# Patient Record
Sex: Male | Born: 1975 | Race: Black or African American | Hispanic: No | Marital: Married | Smoking: Never smoker
Health system: Southern US, Community
[De-identification: ages and names within clinical notes are randomized; demographics above are authoritative.]

## PROBLEM LIST (undated history)

## (undated) DIAGNOSIS — E785 Hyperlipidemia, unspecified: Secondary | ICD-10-CM

## (undated) DIAGNOSIS — K5792 Diverticulitis of intestine, part unspecified, without perforation or abscess without bleeding: Secondary | ICD-10-CM

## (undated) DIAGNOSIS — E119 Type 2 diabetes mellitus without complications: Secondary | ICD-10-CM

## (undated) DIAGNOSIS — I10 Essential (primary) hypertension: Secondary | ICD-10-CM

---

## 2015-05-23 ENCOUNTER — Encounter (HOSPITAL_COMMUNITY): Payer: Self-pay | Admitting: Emergency Medicine

## 2015-05-23 DIAGNOSIS — I10 Essential (primary) hypertension: Secondary | ICD-10-CM | POA: Diagnosis not present

## 2015-05-23 DIAGNOSIS — N4889 Other specified disorders of penis: Secondary | ICD-10-CM | POA: Insufficient documentation

## 2015-05-23 DIAGNOSIS — E119 Type 2 diabetes mellitus without complications: Secondary | ICD-10-CM | POA: Diagnosis not present

## 2015-05-23 DIAGNOSIS — K5732 Diverticulitis of large intestine without perforation or abscess without bleeding: Secondary | ICD-10-CM | POA: Diagnosis not present

## 2015-05-23 DIAGNOSIS — N50819 Testicular pain, unspecified: Secondary | ICD-10-CM | POA: Insufficient documentation

## 2015-05-23 DIAGNOSIS — R1033 Periumbilical pain: Secondary | ICD-10-CM | POA: Diagnosis present

## 2015-05-23 LAB — I-STAT CG4 LACTIC ACID, ED: Lactic Acid, Venous: 1.49 mmol/L (ref 0.5–2.0)

## 2015-05-23 NOTE — ED Notes (Signed)
Pt with hx of diverticulitis. Seen yesterday at his work clinic and told he possibly had a prostate infection. Pt reports he had pain when bending over and also pain in his penis. Pt on antibiotics. Pt sts pain continues today with nvd and fever.

## 2015-05-24 ENCOUNTER — Encounter (HOSPITAL_COMMUNITY): Payer: Self-pay | Admitting: Radiology

## 2015-05-24 ENCOUNTER — Emergency Department (HOSPITAL_COMMUNITY): Payer: BLUE CROSS/BLUE SHIELD

## 2015-05-24 ENCOUNTER — Emergency Department (HOSPITAL_COMMUNITY)
Admission: EM | Admit: 2015-05-24 | Discharge: 2015-05-24 | Disposition: A | Discharge status: Home / Self Care | Payer: BLUE CROSS/BLUE SHIELD | Attending: Emergency Medicine | Admitting: Emergency Medicine

## 2015-05-24 DIAGNOSIS — K5732 Diverticulitis of large intestine without perforation or abscess without bleeding: Secondary | ICD-10-CM

## 2015-05-24 HISTORY — DX: Hyperlipidemia, unspecified: E78.5

## 2015-05-24 HISTORY — DX: Essential (primary) hypertension: I10

## 2015-05-24 HISTORY — DX: Type 2 diabetes mellitus without complications: E11.9

## 2015-05-24 HISTORY — DX: Diverticulitis of intestine, part unspecified, without perforation or abscess without bleeding: K57.92

## 2015-05-24 LAB — COMPREHENSIVE METABOLIC PANEL
ALBUMIN: 3.5 g/dL (ref 3.5–5.0)
ALT: 33 U/L (ref 17–63)
ANION GAP: 13 (ref 5–15)
AST: 27 U/L (ref 15–41)
Alkaline Phosphatase: 60 U/L (ref 38–126)
BILIRUBIN TOTAL: 0.8 mg/dL (ref 0.3–1.2)
BUN: 11 mg/dL (ref 6–20)
CHLORIDE: 101 mmol/L (ref 101–111)
CO2: 25 mmol/L (ref 22–32)
Calcium: 9.1 mg/dL (ref 8.9–10.3)
Creatinine, Ser: 1.13 mg/dL (ref 0.61–1.24)
GFR calc Af Amer: >60 mL/min (ref >60)
GFR calc non Af Amer: >60 mL/min (ref >60)
GLUCOSE: 130 mg/dL — AB (ref 65–99)
POTASSIUM: 3.7 mmol/L (ref 3.5–5.1)
SODIUM: 139 mmol/L (ref 135–145)
TOTAL PROTEIN: 7.2 g/dL (ref 6.5–8.1)

## 2015-05-24 LAB — CBC
HEMATOCRIT: 41.5 % (ref 39.0–52.0)
HEMOGLOBIN: 14.4 g/dL (ref 13.0–17.0)
MCH: 30.4 pg (ref 26.0–34.0)
MCHC: 34.7 g/dL (ref 30.0–36.0)
MCV: 87.6 fL (ref 78.0–100.0)
Platelets: 312 10*3/uL (ref 150–400)
RBC: 4.74 MIL/uL (ref 4.22–5.81)
RDW: 12.9 % (ref 11.5–15.5)
WBC: 17.6 10*3/uL — ABNORMAL HIGH (ref 4.0–10.5)

## 2015-05-24 LAB — URINALYSIS, ROUTINE W REFLEX MICROSCOPIC
BILIRUBIN URINE: NEGATIVE
Glucose, UA: NEGATIVE mg/dL
Hgb urine dipstick: NEGATIVE
Ketones, ur: NEGATIVE mg/dL
LEUKOCYTES UA: NEGATIVE
NITRITE: NEGATIVE
PH: 6 (ref 5.0–8.0)
Protein, ur: 30 mg/dL — AB
SPECIFIC GRAVITY, URINE: 1.029 (ref 1.005–1.030)

## 2015-05-24 LAB — URINE MICROSCOPIC-ADD ON

## 2015-05-24 LAB — LIPASE, BLOOD: LIPASE: 21 U/L (ref 11–51)

## 2015-05-24 LAB — I-STAT CG4 LACTIC ACID, ED: Lactic Acid, Venous: 1.7 mmol/L (ref 0.5–2.0)

## 2015-05-24 MED ORDER — SODIUM CHLORIDE 0.9 % IV BOLUS (SEPSIS)
500.0000 mL | Freq: Once | INTRAVENOUS | Status: AC
  Administered 2015-05-24: 500 mL via INTRAVENOUS

## 2015-05-24 MED ORDER — ONDANSETRON HCL 4 MG/2ML IJ SOLN
4.0000 mg | Freq: Once | INTRAMUSCULAR | Status: AC
  Administered 2015-05-24: 4 mg via INTRAVENOUS
  Filled 2015-05-24: qty 2

## 2015-05-24 MED ORDER — ACETAMINOPHEN 500 MG PO TABS
1000.0000 mg | ORAL_TABLET | Freq: Once | ORAL | Status: AC
  Administered 2015-05-24: 1000 mg via ORAL
  Filled 2015-05-24: qty 2

## 2015-05-24 MED ORDER — OXYCODONE-ACETAMINOPHEN 5-325 MG PO TABS
1.0000 | ORAL_TABLET | Freq: Once | ORAL | Status: AC
  Administered 2015-05-24: 1 via ORAL
  Filled 2015-05-24: qty 1

## 2015-05-24 MED ORDER — HYDROMORPHONE HCL 1 MG/ML IJ SOLN
1.0000 mg | Freq: Once | INTRAMUSCULAR | Status: AC
  Administered 2015-05-24: 1 mg via INTRAVENOUS
  Filled 2015-05-24: qty 1

## 2015-05-24 MED ORDER — IOHEXOL 300 MG/ML  SOLN
100.0000 mL | Freq: Once | INTRAMUSCULAR | Status: AC | PRN
  Administered 2015-05-24: 100 mL via INTRAVENOUS

## 2015-05-24 MED ORDER — METRONIDAZOLE 500 MG PO TABS: 500.0000 mg | ORAL_TABLET | Freq: Two times a day (BID) | ORAL | Status: AC

## 2015-05-24 MED ORDER — METRONIDAZOLE 500 MG PO TABS
500.0000 mg | ORAL_TABLET | Freq: Once | ORAL | Status: AC
  Administered 2015-05-24: 500 mg via ORAL
  Filled 2015-05-24: qty 1

## 2015-05-24 MED ORDER — OXYCODONE-ACETAMINOPHEN 5-325 MG PO TABS: 1.0000 | ORAL_TABLET | Freq: Four times a day (QID) | ORAL | Status: AC | PRN

## 2015-05-24 NOTE — ED Notes (Signed)
Pt transported to CT ?

## 2015-05-24 NOTE — ED Provider Notes (Signed)
I received this patient in signout from Dr. Blinda LeatherwoodPollina, who had evaluated him for several days of abdominal pain and fever. We were awaiting results of CT scan. CT shows acute diverticulitis without intraperitoneal air or abscess. On reexamination, the patient's temperature had improved and he was comfortable. I discussed CT results and treatment for diverticulitis including Flagyl and Cipro. A dose of Flagyl and Percocet here. He already has Cipro at home from clinic visit a few days ago; instructed to take Cipro for 1 week and then discard any remaining. Provided with Percocet for pain control at home. Emphasized the importance of return precautions including any worsening symptoms as well as f/u w/ PCP. Pt voiced understanding and was discharged in satisfactory condition.  CT Abdomen Pelvis W Contrast (Final result) Result time: 05/24/15 08:30:58   Final result by Rad Results In Interface (05/24/15 08:30:58)   Narrative:   CLINICAL DATA: Stomach pain, nausea, vomiting, diarrhea, fever, on antibiotics, possible prostate infection per patient, history diverticulitis, diabetes mellitus, hypertension  EXAM: CT ABDOMEN AND PELVIS WITH CONTRAST  TECHNIQUE: Multidetector CT imaging of the abdomen and pelvis was performed using the standard protocol following bolus administration of intravenous contrast. Sagittal and coronal MPR images reconstructed from axial data set.  CONTRAST: 100mL OMNIPAQUE IOHEXOL 300 MG/ML SOLN IV. Oral contrast was not administered.  COMPARISON: 09/28/2013  FINDINGS: Lung bases clear.  Liver, gallbladder, spleen, pancreas, kidneys, and adrenal glands normal appearance.  Normal appendix.  Scattered diverticulosis of descending and sigmoid colon.  Wall thickening of sigmoid colon with infiltration of sigmoid mesocolon compatible with acute diverticulitis, slightly more distal within sigmoid colon than the acute diverticulitis seen on the previous  exam.  Foci of gas identified within mesocolon without free intraperitoneal air or abscess.  Stomach and remaining bowel loops unremarkable.  Normal appearing bladder, ureters and prostate gland.  No mass, adenopathy, free air, free fluid or hernia.  Scattered normal sized retroperitoneal and pelvic lymph nodes.  Bones unremarkable.  IMPRESSION: Acute sigmoid diverticulitis with foci of extraluminal gas within the adjacent mesocolon.  No free intraperitoneal air or abscess are identified.     Laurence Spatesachel Morgan Little, MD 05/24/15 [phone]

## 2015-05-24 NOTE — Discharge Instructions (Signed)
Diverticulitis °Diverticulitis is inflammation or infection of small pouches in your colon that form when you have a condition called diverticulosis. The pouches in your colon are called diverticula. Your colon, or large intestine, is where water is absorbed and stool is formed. °Complications of diverticulitis can include: °· Bleeding. °· Severe infection. °· Severe pain. °· Perforation of your colon. °· Obstruction of your colon. °CAUSES  °Diverticulitis is caused by bacteria. °Diverticulitis happens when stool becomes trapped in diverticula. This allows bacteria to grow in the diverticula, which can lead to inflammation and infection. °RISK FACTORS °People with diverticulosis are at risk for diverticulitis. Eating a diet that does not include enough fiber from fruits and vegetables may make diverticulitis more likely to develop. °SYMPTOMS  °Symptoms of diverticulitis may include: °· Abdominal pain and tenderness. The pain is normally located on the left side of the abdomen, but may occur in other areas. °· Fever and chills. °· Bloating. °· Cramping. °· Nausea. °· Vomiting. °· Constipation. °· Diarrhea. °· Blood in your stool. °DIAGNOSIS  °Your health care provider will ask you about your medical history and do a physical exam. You may need to have tests done because many medical conditions can cause the same symptoms as diverticulitis. Tests may include: °· Blood tests. °· Urine tests. °· Imaging tests of the abdomen, including X-rays and CT scans. °When your condition is under control, your health care provider may recommend that you have a colonoscopy. A colonoscopy can show how severe your diverticula are and whether something else is causing your symptoms. °TREATMENT  °Most cases of diverticulitis are mild and can be treated at home. Treatment may include: °· Taking over-the-counter pain medicines. °· Following a clear liquid diet. °· Taking antibiotic medicines by mouth for 7-10 days. °More severe cases may  be treated at a hospital. Treatment may include: °· Not eating or drinking. °· Taking prescription pain medicine. °· Receiving antibiotic medicines through an IV tube. °· Receiving fluids and nutrition through an IV tube. °· Surgery. °HOME CARE INSTRUCTIONS  °· Follow your health care provider's instructions carefully. °· Follow a full liquid diet or other diet as directed by your health care provider. After your symptoms improve, your health care provider may tell you to change your diet. He or she may recommend you eat a high-fiber diet. Fruits and vegetables are good sources of fiber. Fiber makes it easier to pass stool. °· Take fiber supplements or probiotics as directed by your health care provider. °· Only take medicines as directed by your health care provider. °· Keep all your follow-up appointments. °SEEK MEDICAL CARE IF:  °· Your pain does not improve. °· You have a hard time eating food. °· Your bowel movements do not return to normal. °SEEK IMMEDIATE MEDICAL CARE IF:  °· Your pain becomes worse. °· Your symptoms do not get better. °· Your symptoms suddenly get worse. °· You have a fever. °· You have repeated vomiting. °· You have bloody or black, tarry stools. °MAKE SURE YOU:  °· Understand these instructions. °· Will watch your condition. °· Will get help right away if you are not doing well or get worse. °  °This information is not intended to replace advice given to you by your health care provider. Make sure you discuss any questions you have with your health care provider. °  °Document Released: 12/10/2004 Document Revised: 03/07/2013 Document Reviewed: 01/25/2013 °Elsevier Interactive Patient Education ©2016 [employer] ° °

## 2015-05-24 NOTE — ED Provider Notes (Signed)
CSN: [id number]     Arrival date & time 05/23/15  2300 History   By signing my name below, I, Bethel Born, attest that this documentation has been prepared under the direction and in the presence of Gilda Crease, MD. Electronically Signed: Bethel Born, ED Scribe. 05/24/2015. 3:40 AM    Chief Complaint  Patient presents with  . Abdominal Pain    The history is provided by the patient. No language interpreter was used.   Nathaniel Rice is a 40 y.o. male with history of diverticulitis, DM, HTN, and HLD who presents to the Emergency Department complaining of new, constant, worsening, 6/10 in severity periumbilical abdominal pain with onset 4 days ago. This pain feels different than pain that he has had in the past with diverticulitis. Pt states that he was seen at his work clinic 2 days ago and diagnosed with a prostate infection. He was started on Cipro but notes that his pain is worse today. Associated symptoms include fever and sharp pain at the penis noted with movement and urination. He had some testicular pain yesterday that has resolved.  Pt denies testicular swelling, dysuria, and difficulty urinating.   Past Medical History  Diagnosis Date  . Diabetes mellitus without complication (HCC)   . Hypertension   . Hyperlipidemia   . Diverticulitis    History reviewed. No pertinent past surgical history. No family history on file. Social History  Substance Use Topics  . Smoking status: Never Smoker   . Smokeless tobacco: None  . Alcohol Use: Yes    Review of Systems  Constitutional: Positive for fever.  Gastrointestinal: Positive for abdominal pain.  Genitourinary: Positive for penile pain and testicular pain. Negative for dysuria, scrotal swelling and difficulty urinating.  All other systems reviewed and are negative.  Allergies  Review of patient's allergies indicates no known allergies.  Home Medications   Prior to Admission medications   Not on File   BP  121/59 mmHg  Pulse 102  Temp(Src) 101.3 F (38.5 C) (Oral)  Resp 16  Ht  (1.803 m)  Wt 327 lb 14.4 oz (148.734 kg)  BMI 45.75 kg/m2  SpO2 97% Physical Exam  Constitutional: He is oriented to person, place, and time. He appears well-developed and well-nourished. No distress.  HENT:  Head: Normocephalic and atraumatic.  Right Ear: Hearing normal.  Left Ear: Hearing normal.  Nose: Nose normal.  Mouth/Throat: Oropharynx is clear and moist and mucous membranes are normal.  Eyes: Conjunctivae and EOM are normal. Pupils are equal, round, and reactive to light.  Neck: Normal range of motion. Neck supple.  Cardiovascular: Regular rhythm, S1 normal and S2 normal.  Exam reveals no gallop and no friction rub.   No murmur heard. Pulmonary/Chest: Effort normal and breath sounds normal. No respiratory distress. He exhibits no tenderness.  Abdominal: Soft. Normal appearance and bowel sounds are normal. There is no hepatosplenomegaly. There is tenderness in the right lower quadrant and periumbilical area. There is guarding. There is no rebound, no tenderness at McBurney's point and negative Murphy's sign. No hernia.  Genitourinary: Right testis shows no swelling and no tenderness. Left testis shows no swelling and no tenderness. No penile tenderness.  Musculoskeletal: Normal range of motion.  Neurological: He is alert and oriented to person, place, and time. He has normal strength. No cranial nerve deficit or sensory deficit. Coordination normal. GCS eye subscore is 4. GCS verbal subscore is 5. GCS motor subscore is 6.  Skin: Skin is warm, dry and  intact. No rash noted. No cyanosis.  Psychiatric: He has a normal mood and affect. His speech is normal and behavior is normal. Thought content normal.  Nursing note and vitals reviewed.   ED Course  Procedures (including critical care time) DIAGNOSTIC STUDIES: Oxygen Saturation is 97% on RA,  normal by my interpretation.    COORDINATION OF  CARE: 3:38 AM Discussed treatment plan which includes lab work and CT A/P with contrast with pt at bedside and pt agreed to plan. Labs Review Labs Reviewed  COMPREHENSIVE METABOLIC PANEL - Abnormal; Notable for the following:    Glucose, Bld 130 (*)    All other components within normal limits  CBC - Abnormal; Notable for the following:    WBC 17.6 (*)    All other components within normal limits  LIPASE, BLOOD  URINALYSIS, ROUTINE W REFLEX MICROSCOPIC (NOT AT [facility])  I-STAT CG4 LACTIC ACID, ED  I-STAT CG4 LACTIC ACID, ED    Imaging Review No results found. I have personally reviewed and evaluated these images and lab results as part of my medical decision-making.   EKG Interpretation None      MDM   Final diagnoses:  None   abdominal pain  She presents to the emergency department for evaluation of fever abdominal pain. Patient reports that he has been sick for 4 days. Pain has been progressively worsening. He saw a physician at his job 2 days ago and it was thought he might have prostatitis. He tells me, however, that the doctor did check his prostate was not enlarged or tender. His urinalysis was also normal.  She does have a history of diverticulitis. He reports that he does have left lower quadrant pain when he has diverticulitis, and his pain has been above his umbilicus. Examination today did reveal tenderness in this area, however, he did have significant right lower quadrant tenderness as well. Differential diagnosis includes appendicitis as well as atypical diverticulitis. CT scan ordered. Will sign out to oncoming ER physician to follow-up on CT scan. If results are uncomplicated diverticulitis, can broaden antibiotic coverage and discharge.  I personally performed the services described in this documentation, which was scribed in my presence. The recorded information has been reviewed and is accurate.    Gilda Creasehristopher J Pollina, MD 05/24/15 [phone]

## 2016-09-11 IMAGING — CT CT ABD-PELV W/ CM
2 of 4 series · 17 of 46 positions shown, 19 images · IV contrast (APPLIED)
Comparison: 09/28/2013

CLINICAL DATA: Stomach pain, nausea, vomiting, diarrhea, fever, on
antibiotics, possible prostate infection per patient, history
diverticulitis, diabetes mellitus, hypertension

EXAM:
CT ABDOMEN AND PELVIS WITH CONTRAST
TECHNIQUE: Multidetector CT imaging of the abdomen and pelvis was performed
using the standard protocol following bolus administration of
intravenous contrast. Sagittal and coronal MPR images reconstructed
from axial data set.
CONTRAST:  100mL OMNIPAQUE IOHEXOL 300 MG/ML SOLN IV. Oral contrast
was not administered.

[Series 2: abd/ pelvis 5.0 i30f 1 · axial · 0.89mm/px · z∈[+904,+1394]mm · 14 of 108 slices shown, 16 images]
[im 5/108  soft-tissue]
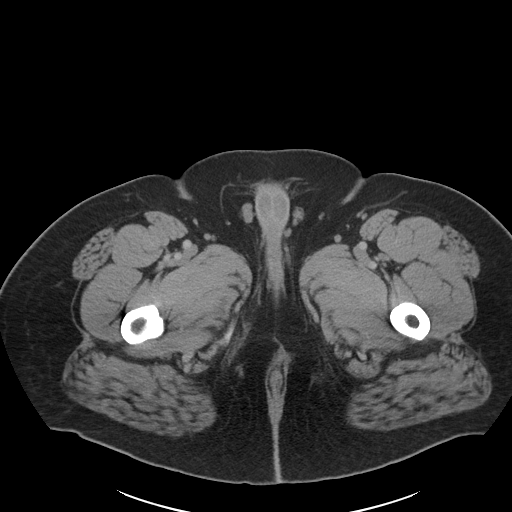
[im 5/108  bone]
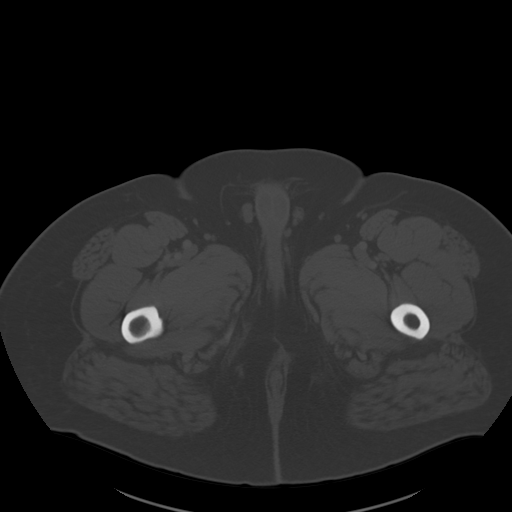
[im 14/108  soft-tissue]
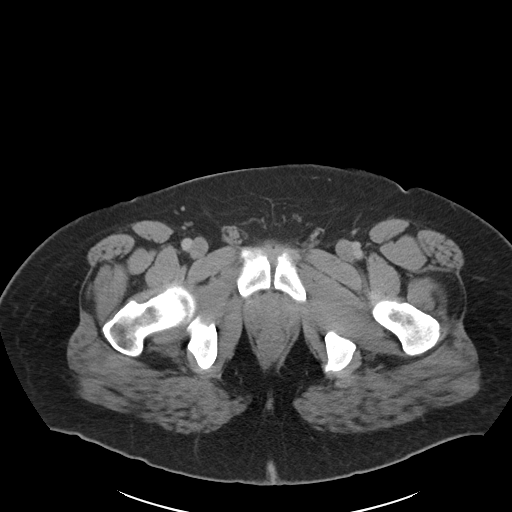
[im 23/108  soft-tissue]
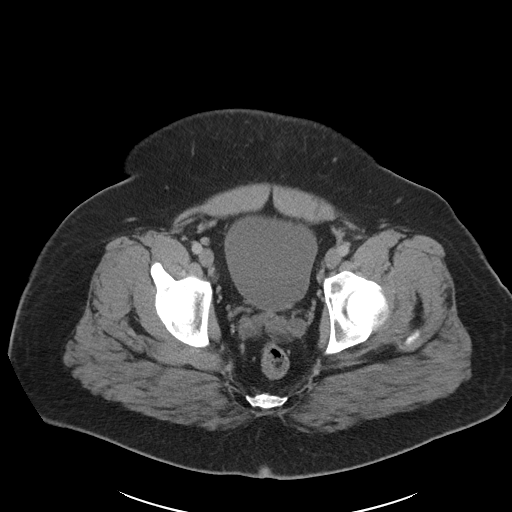
[im 27/108  soft-tissue]
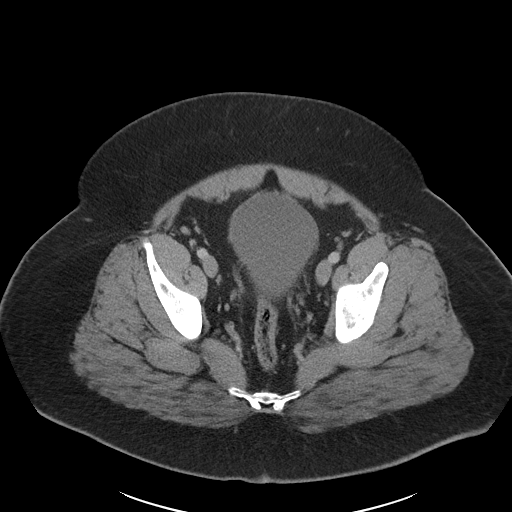
[im 36/108  soft-tissue]
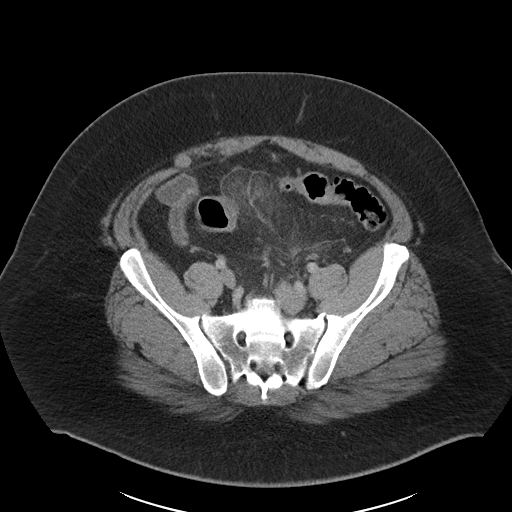
[im 45/108  soft-tissue]
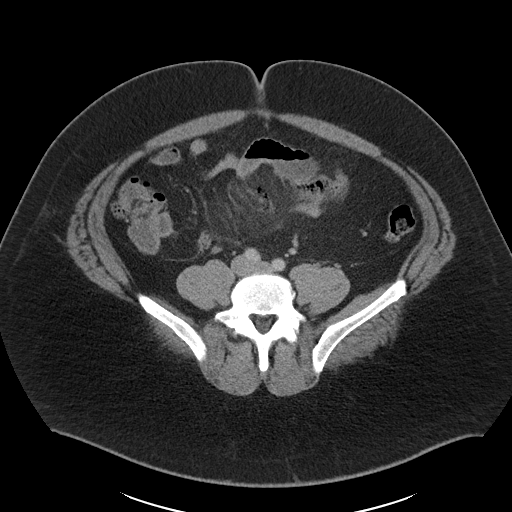
[im 50/108  soft-tissue]
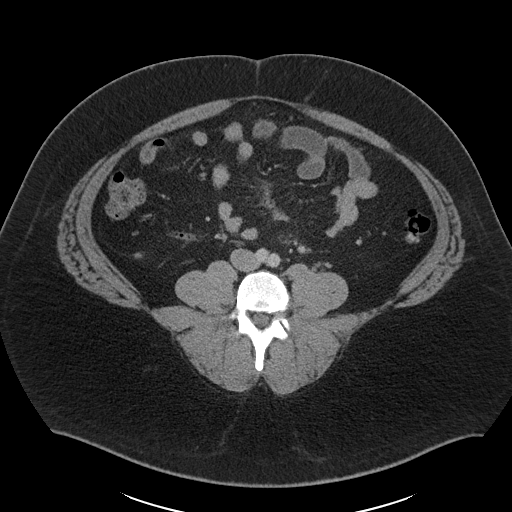
[im 58/108  soft-tissue]
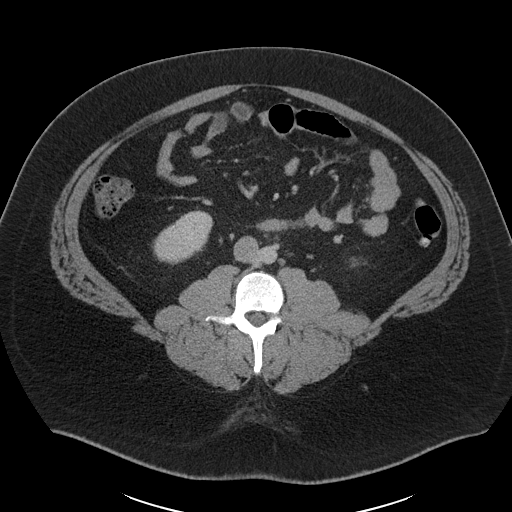
[im 63/108  soft-tissue]
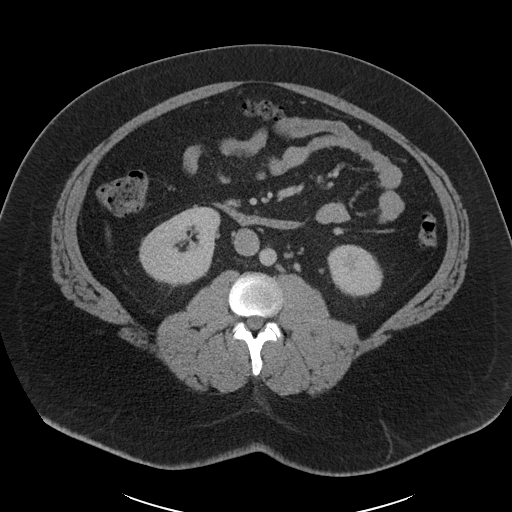
[im 63/108  bone]
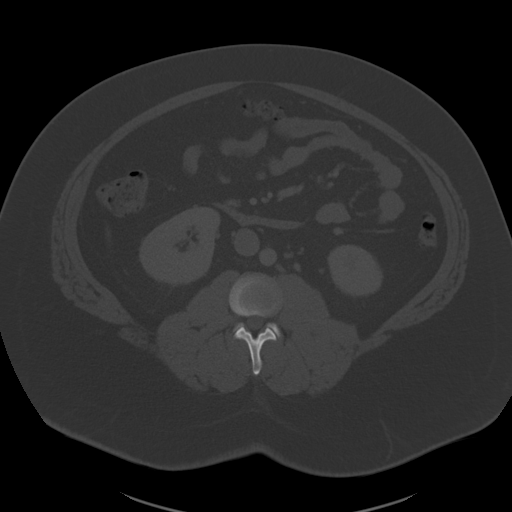
[im 72/108  soft-tissue]
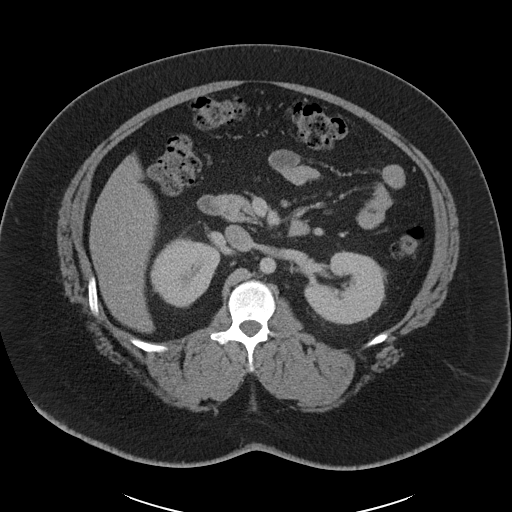
[im 81/108  soft-tissue]
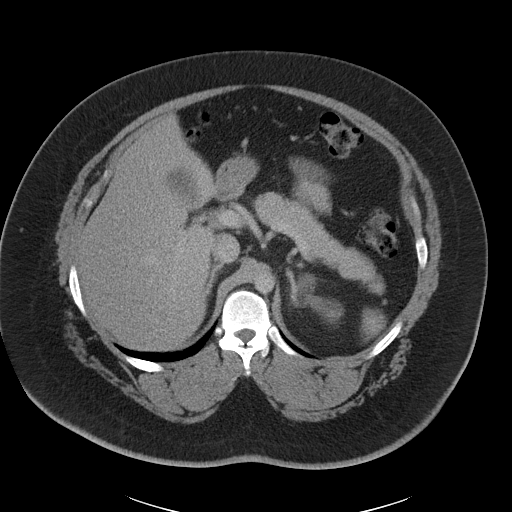
[im 85/108  soft-tissue]
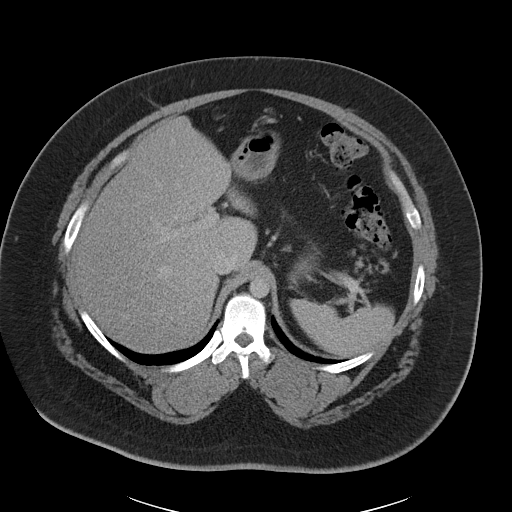
[im 94/108  soft-tissue]
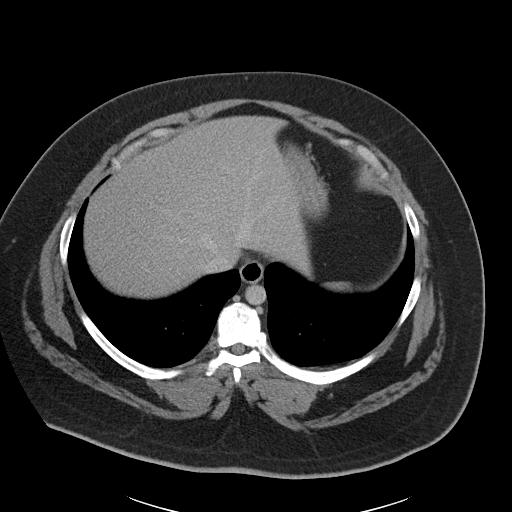
[im 103/108  soft-tissue]
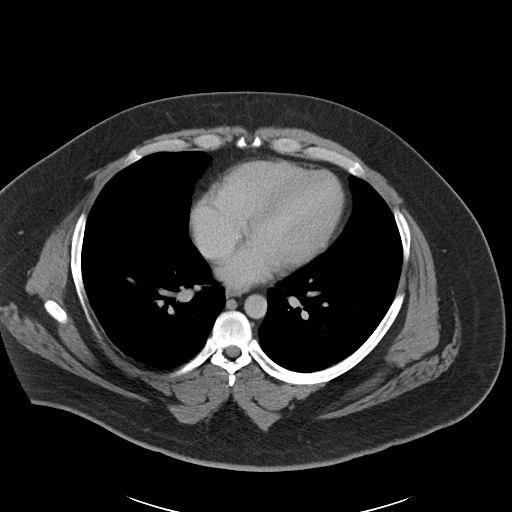

[Series 5: coronal soft tissue · coronal · 0.92mm/px · 3 of 132 slices shown]
[im 44/132  soft-tissue]
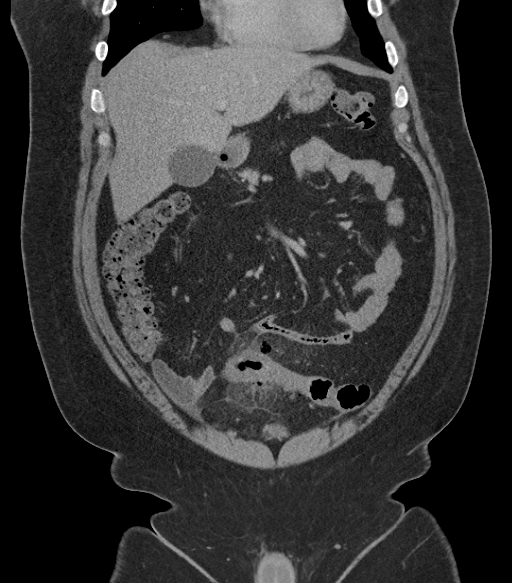
[im 59/132  soft-tissue]
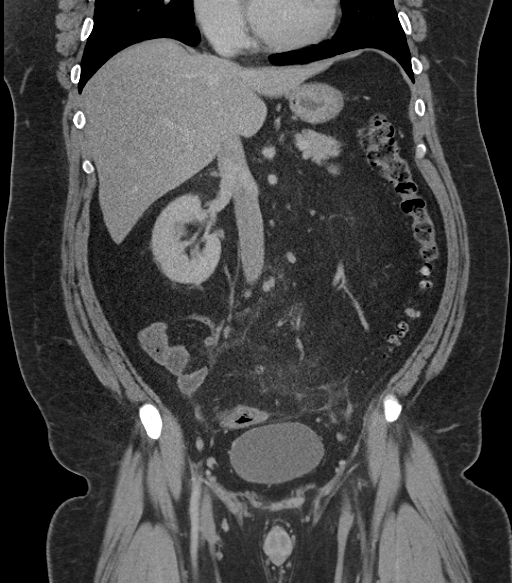
[im 73/132  soft-tissue]
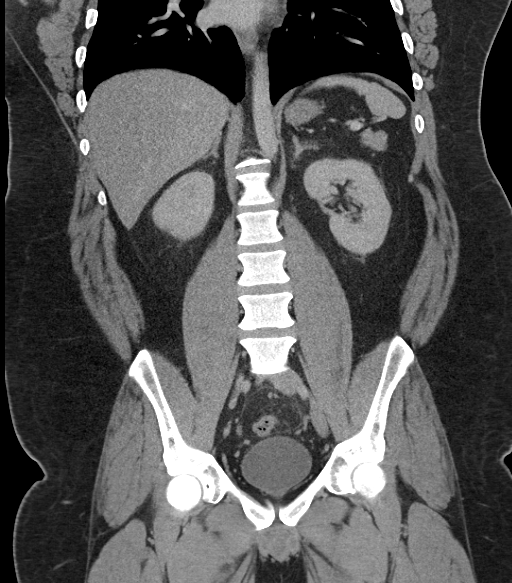

[17 of 46 positions shown; findings below may reference images not displayed]

FINDINGS: Lung bases clear.

Liver, gallbladder, spleen, pancreas, kidneys, and adrenal glands
normal appearance.

Normal appendix.

Scattered diverticulosis of descending and sigmoid colon.

Wall thickening of sigmoid colon with infiltration of sigmoid
mesocolon compatible with acute diverticulitis, slightly more distal
within sigmoid colon than the acute diverticulitis seen on the
previous exam.

Foci of gas identified within mesocolon without free intraperitoneal
air or abscess.

Stomach and remaining bowel loops unremarkable.

Normal appearing bladder, ureters and prostate gland.

No mass, adenopathy, free air, free fluid or hernia.

Scattered normal sized retroperitoneal and pelvic lymph nodes.

Bones unremarkable.
IMPRESSION: Acute sigmoid diverticulitis with foci of extraluminal gas within
the adjacent mesocolon.

No free intraperitoneal air or abscess are identified.

## 2019-01-07 ENCOUNTER — Other Ambulatory Visit: Payer: Self-pay

## 2019-01-07 DIAGNOSIS — Z20822 Contact with and (suspected) exposure to covid-19: Secondary | ICD-10-CM

## 2019-01-09 LAB — NOVEL CORONAVIRUS, NAA: SARS-CoV-2, NAA: NOT DETECTED

## 2019-01-19 ENCOUNTER — Other Ambulatory Visit: Payer: Self-pay

## 2019-01-19 DIAGNOSIS — Z20822 Contact with and (suspected) exposure to covid-19: Secondary | ICD-10-CM

## 2019-01-20 LAB — NOVEL CORONAVIRUS, NAA: SARS-CoV-2, NAA: NOT DETECTED

## 2019-02-20 ENCOUNTER — Other Ambulatory Visit: Payer: Self-pay

## 2019-02-20 DIAGNOSIS — Z20822 Contact with and (suspected) exposure to covid-19: Secondary | ICD-10-CM

## 2019-02-21 LAB — NOVEL CORONAVIRUS, NAA: SARS-CoV-2, NAA: NOT DETECTED

## 2019-03-06 ENCOUNTER — Other Ambulatory Visit: Payer: Self-pay | Admitting: *Deleted

## 2019-03-06 DIAGNOSIS — Z20822 Contact with and (suspected) exposure to covid-19: Secondary | ICD-10-CM

## 2019-03-08 LAB — NOVEL CORONAVIRUS, NAA: SARS-CoV-2, NAA: NOT DETECTED

## 2019-06-08 ENCOUNTER — Ambulatory Visit: Payer: Self-pay | Attending: Internal Medicine

## 2019-06-08 DIAGNOSIS — Z23 Encounter for immunization: Secondary | ICD-10-CM

## 2019-06-08 NOTE — Progress Notes (Signed)
   Covid-19 Vaccination Clinic  Name:  Nathaniel Rice    MRN: [id number] DOB: 1976/03/08  06/08/2019  [name] was observed post Covid-19 immunization for 15 minutes without incident. He was provided with Vaccine Information Sheet and instruction to access the V-Safe system.   [name] was instructed to call 911 with any severe reactions post vaccine: Marland Kitchen Difficulty breathing  . Swelling of face and throat  . A fast heartbeat  . A bad rash all over body  . Dizziness and weakness   Immunizations Administered    Name Date Dose VIS Date Route   Pfizer COVID-19 Vaccine 06/08/2019 10:53 AM 0.3 mL 02/24/2019 Intramuscular   Manufacturer: [employer], [employer]   Lot: [id number]   NDC: [address]-7

## 2019-06-19 ENCOUNTER — Ambulatory Visit: Payer: BLUE CROSS/BLUE SHIELD

## 2019-07-03 ENCOUNTER — Ambulatory Visit: Payer: Self-pay | Attending: Internal Medicine

## 2019-07-03 DIAGNOSIS — Z23 Encounter for immunization: Secondary | ICD-10-CM

## 2019-07-03 NOTE — Progress Notes (Signed)
   Covid-19 Vaccination Clinic  Name:  Nathaniel Rice    MRN: [id number] DOB: [birth year]  07/03/2019  [name] was observed post Covid-19 immunization for 15 minutes without incident. He was provided with Vaccine Information Sheet and instruction to access the V-Safe system.   [name] was instructed to call 911 with any severe reactions post vaccine: Marland Kitchen Difficulty breathing  . Swelling of face and throat  . A fast heartbeat  . A bad rash all over body  . Dizziness and weakness   Immunizations Administered    Name Date Dose VIS Date Route   Pfizer COVID-19 Vaccine 07/03/2019 10:22 AM 0.3 mL 05/10/2018 Intramuscular   Manufacturer: [employer], [employer]   Lot: [id number]   NDC: [address]-4

## 2020-02-12 ENCOUNTER — Encounter (HOSPITAL_COMMUNITY): Payer: Self-pay | Admitting: Emergency Medicine

## 2020-02-12 ENCOUNTER — Emergency Department (HOSPITAL_COMMUNITY)
Admission: EM | Admit: 2020-02-12 | Discharge: 2020-02-13 | Disposition: A | Discharge status: Home / Self Care | Payer: BC Managed Care – PPO | Attending: Emergency Medicine | Admitting: Emergency Medicine

## 2020-02-12 ENCOUNTER — Emergency Department (HOSPITAL_COMMUNITY): Payer: BC Managed Care – PPO

## 2020-02-12 ENCOUNTER — Other Ambulatory Visit: Payer: Self-pay

## 2020-02-12 DIAGNOSIS — R2231 Localized swelling, mass and lump, right upper limb: Secondary | ICD-10-CM | POA: Diagnosis present

## 2020-02-12 DIAGNOSIS — E119 Type 2 diabetes mellitus without complications: Secondary | ICD-10-CM | POA: Insufficient documentation

## 2020-02-12 DIAGNOSIS — Z79899 Other long term (current) drug therapy: Secondary | ICD-10-CM | POA: Diagnosis not present

## 2020-02-12 DIAGNOSIS — I10 Essential (primary) hypertension: Secondary | ICD-10-CM | POA: Diagnosis not present

## 2020-02-12 DIAGNOSIS — L03011 Cellulitis of right finger: Secondary | ICD-10-CM | POA: Insufficient documentation

## 2020-02-12 NOTE — ED Triage Notes (Signed)
Pt sent to ED by PCP for  Further evaluation of possible infection on his right index.

## 2020-02-14 NOTE — ED Provider Notes (Signed)
[facility] EMERGENCY DEPARTMENT Provider Note   CSN: [id number] Arrival date & time: 02/12/20  1836     History Chief Complaint  Patient presents with  . Wound Check    finger    Nathaniel Rice is a 44 y.o. male.  HPI     44yo male with history of DM, htn, hlpd presents with concern for finger infection.  Reports index finger of right hand had area of swelling/pus that he drained.  Had been present for about a week.  Used a sterile needle to drain it, irrigated it, used hydrogen peroxide and antiobitic ointment. Reports pus came out and the initial area that was yellow and next to fingernail went down and improved. Had some continuing pain to finger around the nail, describes as tightness. No fever, no severe pain.  His doctor was concerned given swelling that he was developing a felon and had him call hand surgery. He called the hand surgeon who told him to go to the ED. He was given keflex rx.    Past Medical History:  Diagnosis Date  . Diabetes mellitus without complication (HCC)   . Diverticulitis   . Hyperlipidemia   . Hypertension     There are no problems to display for this patient.   History reviewed. No pertinent surgical history.     No family history on file.  Social History   Tobacco Use  . Smoking status: Never Smoker  Substance Use Topics  . Alcohol use: Yes  . Drug use: No    Home Medications Prior to Admission medications   Medication Sig Start Date End Date Taking? Authorizing Provider  atorvastatin (LIPITOR) 40 MG tablet Take 40 mg by mouth daily.    [provider]  ciprofloxacin (CIPRO) 500 MG tablet Take 500 mg by mouth 2 (two) times daily. 05/22/15   [provider]  losartan (COZAAR) 50 MG tablet Take 50 mg by mouth daily. 04/07/15   [provider]  metroNIDAZOLE (FLAGYL) 500 MG tablet Take 1 tablet (500 mg total) by mouth 2 (two) times daily. 05/24/15   Little, Ambrose Finland, MD  omeprazole  (PRILOSEC) 40 MG capsule Take 40 mg by mouth daily. 03/20/15   [provider]  oxyCODONE-acetaminophen (PERCOCET) 5-325 MG tablet Take 1-2 tablets by mouth every 6 (six) hours as needed. 05/24/15   Little, Ambrose Finland, MD    Allergies    Patient has no known allergies.  Review of Systems   Review of Systems  Constitutional: Negative for chills and fever.  Respiratory: Negative for cough.   Gastrointestinal: Negative for nausea and vomiting.  Musculoskeletal: Positive for arthralgias.  Skin: Positive for wound.  Neurological: Negative for syncope.    Physical Exam Updated Vital Signs BP (!) 142/87 (BP Location: Right Arm)   Pulse 73   Temp 98.6 F (37 C) (Oral)   Resp 16   Ht 5\' 11"  (1.803 m)   Wt (!) 148.7 kg   SpO2 98%   BMI 45.72 kg/m   Physical Exam Vitals and nursing note reviewed.  Constitutional:      General: He is not in acute distress.    Appearance: Normal appearance. He is not ill-appearing, toxic-appearing or diaphoretic.  HENT:     Head: Normocephalic.  Eyes:     Conjunctiva/sclera: Conjunctivae normal.  Cardiovascular:     Rate and Rhythm: Normal rate and regular rhythm.     Pulses: Normal pulses.  Pulmonary:     Effort: Pulmonary  effort is normal. No respiratory distress.  Musculoskeletal:        General: No deformity or signs of injury.     Cervical back: No rigidity.     Comments: See attached photos. Mild erythema to distal finger. No fluctuance, no tenderness to pad of finger.  Area previous drainage to edge of fingernail, no continuing fluctuance    Skin:    General: Skin is warm and dry.     Coloration: Skin is not jaundiced or pale.  Neurological:     General: No focal deficit present.     Mental Status: He is alert and oriented to person, place, and time.            ED Results / Procedures / Treatments   Labs (all labs ordered are listed, but only abnormal results are displayed) Labs Reviewed - No data to  display  EKG None  Radiology No results found.  Procedures Procedures (including critical care time)  Medications Ordered in ED Medications - No data to display  ED Course  I have reviewed the triage vital signs and the nursing notes.  Pertinent labs & imaging results that were available during my care of the patient were reviewed by me and considered in my medical decision making (see chart for details).    MDM Rules/Calculators/A&P                          44yo male with history of DM, htn, hlpd presents with concern for finger infection. XR done shows no acute findings.  Does not have pad of finger pain or fluctuance on exam and do not feel exam is consistent with a felon.  No sign of flexor tenosynovitis.  Feel presentation consistent with recent paronychia, do not see area that requires continued drainage. Swelling to distal finger seems consistent with recent infection but do not see signs of abscess/felon. Recommend continued warm soaks, abx that were prescribed in urgent care.  Discussed strict return precautions.  Final Clinical Impression(s) / ED Diagnoses Final diagnoses:  Paronychia of right index finger    Rx / DC Orders ED Discharge Orders    None       Alvira Monday, MD 02/14/20 2242

## 2021-06-02 IMAGING — CR DG FINGER INDEX 2+V*R*
3 series · 3 of 3 positions shown · non-contrast
Comparison: None.

CLINICAL DATA: Finger infection 3 days.  Diabetes.

EXAM:
RIGHT INDEX FINGER 2+V

[finger ap]
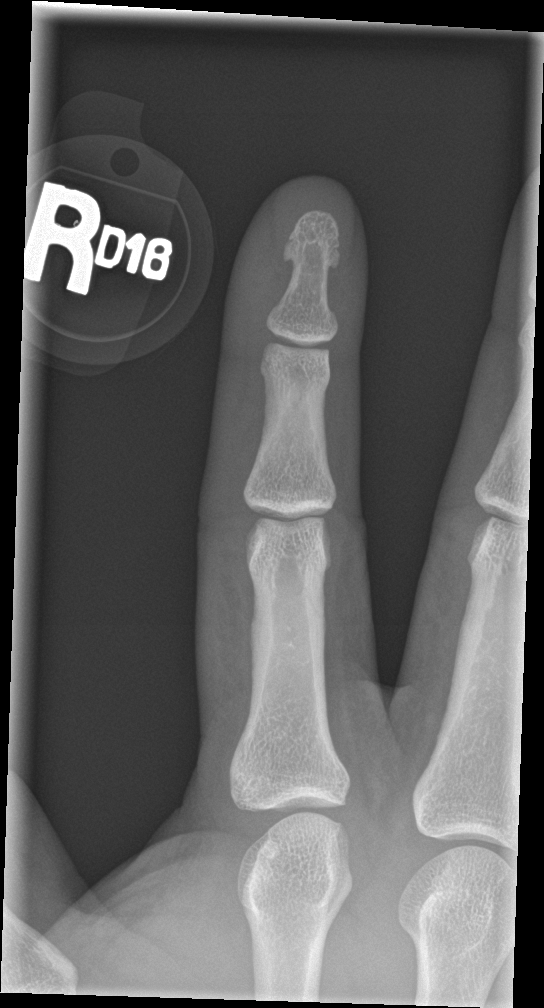

[finger obl]
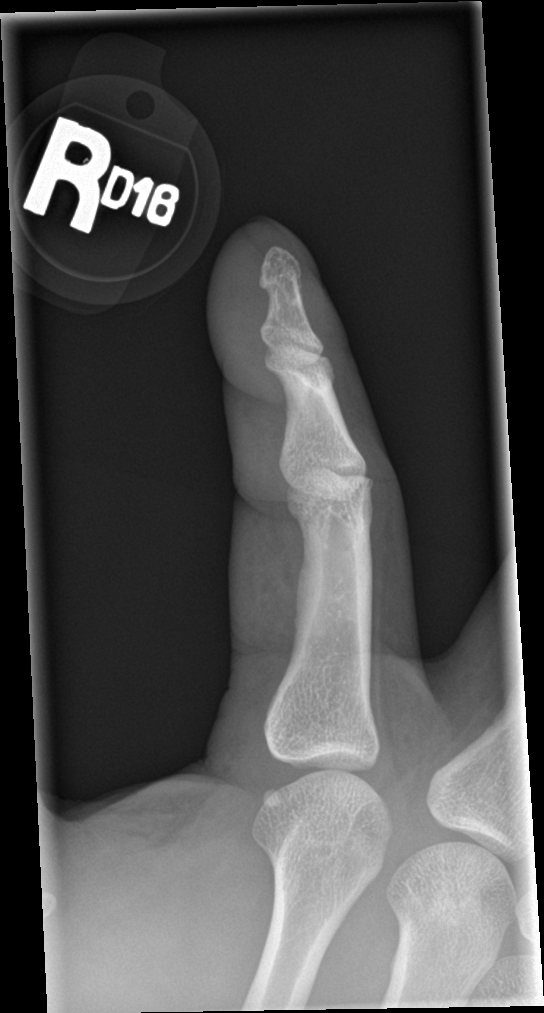

[finger lat]
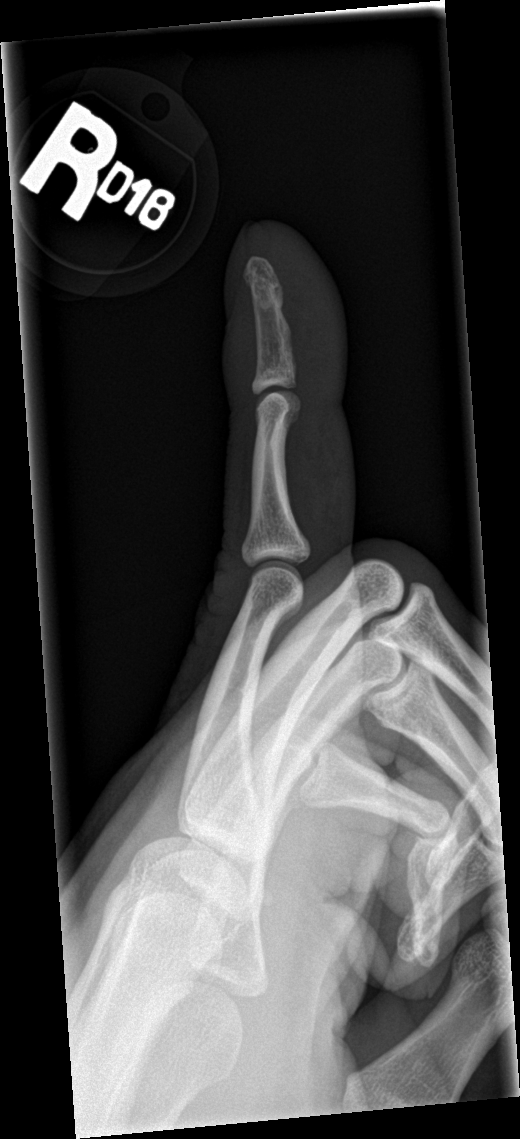

[3 of 3 positions shown; findings below may reference images not displayed]

FINDINGS: There is no evidence of fracture or dislocation. There is no
evidence of arthropathy or other focal bone abnormality. Soft
tissues are unremarkable.
IMPRESSION: Negative.
